# Patient Record
Sex: Female | Born: 1990
Health system: Western US, Academic
[De-identification: ages and names within clinical notes are randomized; demographics above are authoritative.]

---

## 2021-09-12 ENCOUNTER — Ambulatory Visit: Payer: PRIVATE HEALTH INSURANCE

## 2021-09-12 DIAGNOSIS — R140 Abdominal distension (gaseous): Secondary | ICD-10-CM

## 2021-09-12 NOTE — Progress Notes
S:  30 y.o. female with below past medical history presenting for:    Chief Complaint   Patient presents with   ? Bloating     Ongoing, x 1 wk     Feels lower abd has been larger for ~10d  Nothing like this before  No dysuria, urgency, or constipation  Not worsened w/ certain foods  No f/c    Reports hx of GAD  Cardiac eval in past for tachycardia/palpitations unrevealing for serious cause    LMP - does not have on Lo Loestrin  Reports missing one dose 3 mo ago otherwise compliant    ROS:  Negative except as stated above.    No past medical history on file.       O:  BP 121/78  ~ Pulse (!) 114  ~ Temp 36.7 ?C (98.1 ?F) (Forehead)  ~ Resp 18  ~ Ht 5' 3'' (1.6 m)  ~ Wt 121 lb (54.9 kg)  ~ SpO2 100%  ~ BMI 21.43 kg/m?     Vitals reviewed  General: NAD  HEENT: NC/AT  CV: Regular rhythm, tachycardia  Lungs: NL CTA b/l no wrr  Abd: S/NT/ND throughout, no masses, no rebound or guarding  Neuro: No gross deficits    Recent Results (from the past 24 hour(s))   POCT urinalysis dipstick    Collection Time: 09/12/21  9:19 AM   Result Value Ref Range    Glucose Negative Negative    Bilirubin Negative Negative    Ketone Trace (A) Negative    Specific Gravity 1.030 <1.005, 1.005, 1.010, 1.015, 1.020, 1.025, 1.030    Blood Trace (A) Negative    pH 6.5 <5.0, 5.0, 5.5, 6.0, 6.5, 7.0, 7.5, 8.0, 8.5    Protein Trace (A) Negative    Urobilinogen 1.0 mg/dL 0.2 mg/dL, 1.0 mg/dL    Nitrite Negative Negative    Leukocyte Negative Negative   POCT urine pregnancy    Collection Time: 09/12/21  9:20 AM   Result Value Ref Range    Preg Test, Ur, Manual Negative          A/P:  30 y.o. female presenting with:  1. Abdominal bloating  - POCT urinalysis dipstick; Future  - POCT urine pregnancy; Future  - POCT urinalysis dipstick  - POCT urine pregnancy  - Bacterial Culture Urine, CLINIC COLLECT TODAY; Future  - Bacterial Culture Urine, CLINIC COLLECT TODAY    Exam unremarkable. Pt describes lower abd fullness, no tenderness, no urinary or vaginal symptoms. No issues w/ specific foods. Urine studies show neg pregnancy, trace microscopic hematuria, trace ketones, trace protein. Advise ample hydration and will repeat urine study in one month during annual exam.  F/u culture.  -Reviewed available results and/or past documentation.  -Return precautions and/or emergency room precautions were provided for persistent or worsening symptoms when appropriate.  -The above plan of care, diagnosis, orders, and follow-up were discussed with the patient.  Questions related to this recommended plan of care were answered.    Portions of this note may have been created with voice recognition software. Occasional wrong-word or ''sound-alike'' substitutions may have occurred due to the inherent limitations of voice recognition software. Please read the chart carefully and recognize, using context, where these substitutions have occurred.

## 2021-09-14 LAB — Bacterial Culture Urine: BACTERIAL CULTURE URINE: NO GROWTH {titer}

## 2021-10-14 ENCOUNTER — Ambulatory Visit: Payer: PRIVATE HEALTH INSURANCE

## 2021-10-14 DIAGNOSIS — R3129 Other microscopic hematuria: Secondary | ICD-10-CM

## 2021-10-14 DIAGNOSIS — Z0000 Encounter for general adult medical examination without abnormal findings: Secondary | ICD-10-CM

## 2021-10-15 LAB — UA,Microscopic: SQUAMOUS EPITHELIAL CELLS: 0 {cells}/uL (ref 0–17)

## 2021-10-15 LAB — UA,Dipstick: PH,URINE: 6 (ref 5.0–8.0)

## 2021-10-15 NOTE — Progress Notes
S:  31 y.o. female with below past medical history presenting for:    Chief Complaint   Patient presents with    Follow-up     Better, meds refill Lo loestrin     Follow up for abdominal bloating  Feeling improved and reports no abdominal symptoms  Needs OCP refill    ROS:  Negative except as stated above.    No past medical history on file.       O:  BP 124/84  ~ Pulse 92  ~ Temp 36.6 C (97.8 F) (Forehead)  ~ Resp 15  ~ Ht 5' 3'' (1.6 m)  ~ Wt 120 lb (54.4 kg)  ~ SpO2 100%  ~ BMI 21.26 kg/m     Vitals reviewed  General: NAD  HEENT: NC/AT  CV: RR  Lungs: NL  Abd: S/NT/ND no masses  Neuro: No gross deficits    No results found for this or any previous visit (from the past 24 hour(s)).      A/P:  31 y.o. female presenting with:  1. Abdominal bloating    2. Microscopic hematuria  - Urinalysis w/Reflex to Culture; Future  - Urinalysis w/Reflex to Culture    3. Routine health maintenance  - Referral to Obstetrics & Gynecology    1. Resolved. Monitor.  2. Trace microscopic hematuria noted at last visit. Repeat UA.  3. GYN referral request for routine care  -Reviewed available results and/or past documentation.  -Return precautions for persistent or worsening symptoms when appropriate.  -The above plan of care, diagnosis, orders, and follow-up were discussed with the patient.  Questions related to this recommended plan of care were answered.    Portions of this note may have been created with voice recognition software. Occasional wrong-word or ''sound-alike'' substitutions may have occurred due to the inherent limitations of voice recognition software. Please read the chart carefully and recognize, using context, where these substitutions have occurred.

## 2021-10-19 DIAGNOSIS — R140 Abdominal distension (gaseous): Secondary | ICD-10-CM

## 2021-10-19 DIAGNOSIS — Z30011 Encounter for initial prescription of contraceptive pills: Secondary | ICD-10-CM

## 2021-10-19 MED ORDER — NORETHIN-ETH ESTRAD-FE BIPHAS 1 MG-10 MCG / 10 MCG PO TABS
1 | Freq: Every day | ORAL | 3 refills | Status: AC
Start: 2021-10-19 — End: ?

## 2021-10-19 NOTE — Addendum Note
Addended by: Oren Section on: 10/19/2021 07:10 AM     Modules accepted: Orders

## 2022-04-17 ENCOUNTER — Ambulatory Visit: Payer: PRIVATE HEALTH INSURANCE

## 2022-12-01 ENCOUNTER — Telehealth: Payer: PRIVATE HEALTH INSURANCE

## 2022-12-01 ENCOUNTER — Telehealth: Payer: BLUE CROSS/BLUE SHIELD

## 2022-12-01 DIAGNOSIS — Z30011 Encounter for initial prescription of contraceptive pills: Secondary | ICD-10-CM

## 2022-12-01 MED ORDER — LO LOESTRIN FE 1 MG-10 MCG / 10 MCG PO TABS
1.0 | Freq: Every day | ORAL | 3 refills
Start: 2022-12-01 — End: ?

## 2022-12-01 MED ORDER — NORETHIN-ETH ESTRAD-FE BIPHAS 1 MG-10 MCG / 10 MCG PO TABS
1 | Freq: Every day | ORAL | 3 refills | Status: AC
Start: 2022-12-01 — End: ?

## 2022-12-01 NOTE — Telephone Encounter
Refill Request    Verified patient was seen within the last 6 months. If not seen within 6 months, offered appointment. Yes, pt wanted to see if calling the office she can get it refilled did try to make appt but she wanted to send a message preferably.She's on a  time crunch pt unable to come in due to traveling 12/02/22 and will return end of March     Collected prescription information from patient. norethindrone-ethinyl estradiol (LO LOESTRIN FE) 1 mg-10 mcg/10 mcg tablet     Pended orders for provider to review and sign.    Pharmacy location was confirmed and class was set for pended orders.   [id number] [place]    Phone: [phone]  Fax: [phone]      Patient or caller has been notified of the turnaround time of 1-2 business day(s).

## 2022-12-01 NOTE — Progress Notes
[facility]  Telehealth   Immediate Care Progress Note        Date: 12/01/2022    Patient Consent to Telehealth   The patient agreed to participate in the video visit prior to joining the visit.        SUBJECTIVE:     Mary Lester is a 32 y.o. female who presents for:    Wanted refill for OCP Lo Loestrin. She took last one today. Leaving for a 3 weeks trip tomorrow. Requesting refill. No other concerns.      No past medical history on file.  No past surgical history on file.  No family history on file.  Social History     Socioeconomic History    Marital status: Single       Medication:  Prior to Admission medications    Medication Sig Start Date End Date Taking? Authorizing Provider   busPIRone 5 mg tablet  06/13/19   PROVIDER, HISTORICAL   levocetirizine 5 mg tablet Take by mouth. 09/11/18   PROVIDER, HISTORICAL   norethindrone-ethinyl estradiol (LO LOESTRIN FE) 1 mg-10 mcg/10 mcg tablet Take 1 tablet by mouth daily. 10/19/21   Thurnell Garbe., MD   vilazodone 20 mg tablet  06/13/19   PROVIDER, HISTORICAL       Allergies: No Known Allergies    PHYSICAL EXAM:     There were no vitals taken for this visit.    This was a Telehealth visit. The extent of the physical exam was limited to the elements below.     Constitutional: healthy,  alert and  not in acute distress  Mental Status: Alert and oriented to time, place and person.  Recent and remote memory are intact. Mood and affect are normal.      ASSESSMENT & PLAN:     Mary Lester is a 32 y.o. female who presents for:    Diagnoses and all orders for this visit:    Encounter for prescription of oral contraceptives  -     norethindrone-ethinyl estradiol (LO LOESTRIN FE) 1 mg-10 mcg/10 mcg tablet; Take 1 tablet by mouth daily.      - Refill OCP  - Discussed if missed days then recommend secondary protection using barrier method      Orders Placed This Encounter    norethindrone-ethinyl estradiol (LO LOESTRIN FE) 1 mg-10 mcg/10 mcg tablet       The above plan was explained and reviewed extensively with the patient.  All questions were answered.    Deatra James, MD  Family Medicine  [facility]

## 2022-12-01 NOTE — Telephone Encounter
Patient requesting norethindrone-ethinyl estradiol (LO LOESTRIN FE) 1 mg-10 mcg/10 mcg tablet. Last seen 10/19/2021. Encounter routed to [employer] to assist patient with visit.

## 2022-12-01 NOTE — Telephone Encounter
Call pt & advised pt set up a virutal visit with immediate care for her refill request as PCP did not have availability for today at the moment. Pt was on a time crunch & said will attempt to schedule with IC

## 2023-02-19 MED ORDER — NORETHIN-ETH ESTRAD-FE BIPHAS 1 MG-10 MCG / 10 MCG PO TABS
1 | Freq: Every day | ORAL | 3 refills
Start: 2023-02-19 — End: ?

## 2023-11-02 ENCOUNTER — Other Ambulatory Visit: Payer: PRIVATE HEALTH INSURANCE

## 2023-11-02 ENCOUNTER — Ambulatory Visit: Payer: PRIVATE HEALTH INSURANCE

## 2023-11-02 DIAGNOSIS — Z30011 Encounter for initial prescription of contraceptive pills: Secondary | ICD-10-CM

## 2023-11-02 MED ORDER — NORETHIN-ETH ESTRAD-FE BIPHAS 1 MG-10 MCG / 10 MCG PO TABS
1 | Freq: Every day | ORAL | 2 refills | Status: AC
Start: 2023-11-02 — End: ?

## 2023-11-02 MED ORDER — NORETHIN-ETH ESTRAD-FE BIPHAS 1 MG-10 MCG / 10 MCG PO TABS
1 | Freq: Every day | ORAL | 3 refills
Start: 2023-11-02 — End: ?

## 2023-11-02 NOTE — Progress Notes
[facility]    Video Visit    Patient Consent to Telehealth   The patient agreed to participate in the video visit prior to joining the visit.        Patient:  Mary Lester  Medical record number:  [id number]  Date of birth:  1991/04/18  Date of service:  11/02/2023    Cc: refill of ocps     SUBJECTIVE:     Mary Lester is a 32 y.o. female who wishes to discuss:    1. Ocp refill : last pap smear about 1 year ago   Doing well on lo loestrin   No side effects  Normal bp 05/2023      There is no problem list on file for this patient.      Medication:  Outpatient Medications Prior to Visit   Medication Sig    busPIRone 5 mg tablet     levocetirizine 5 mg tablet Take by mouth.    vilazodone 20 mg tablet     norethindrone-ethinyl estradiol (LO LOESTRIN FE) 1 mg-10 mcg/10 mcg tablet Take 1 tablet by mouth daily.     No facility-administered medications prior to visit.       Allergies: No Known Allergies    Social History     Tobacco Use    Smoking status: Not on file    Smokeless tobacco: Not on file   Substance Use Topics    Alcohol use: Not on file         Review of Systems: see HPI    PHYSICAL EXAM:       General Exam: The patient is well-developed, well-nourished and in no apparent distress.   Neurologic/Psychiatric: The patient is alert and oriented. Patient's affect is appropriate.  HEENT: Head is normocephalic, atraumatic. Sclerae are anicteric.  There is no epistaxis. Normally-structured ears.   Respiratory: Breathing is non-labored. There is no use of accessory muscles.     Integument: There is no facial ecchymosis.    STUDIES:     Relevant labs and imaging were reviewed today by me in conjunction with the patient.    Office Visit on 10/14/2021   Component Date Value Ref Range Status    Urine Color 10/14/2021 Light-Yellow    Final    Specific Gravity 10/14/2021 1.013  1.005 - 1.030 Final    pH,Urine 10/14/2021 6.0  5.0 - 8.0 Final    Blood 10/14/2021 Negative  Negative Final    Bilirubin 10/14/2021 Negative  Negative Final    Ketones 10/14/2021 Negative  Negative Final    Glucose 10/14/2021 Negative  Negative Final    Protein 10/14/2021 Negative  Negative Final    Leukocyte Esterase 10/14/2021 Negative  Negative Final    Nitrite 10/14/2021 Negative  Negative Final    RBC per uL 10/14/2021 0  0 - 11 cells/uL Final    WBC per uL 10/14/2021 7  0 - 22 cells/uL Final    RBC per HPF 10/14/2021 0  0 - 2 cells/HPF Final    WBC per HPF 10/14/2021 1  0 - 4 cells/HPF Final    Squamous Epi Cells 10/14/2021 0  0 - 17 cells/uL Final         ASSESSMENT & PLAN:       Diagnoses and all orders for this visit:    Encounter for prescription of oral contraceptives  -     norethindrone-ethinyl estradiol (LO LOESTRIN FE) 1 mg-10 mcg/10 mcg tablet; Take 1  tablet by mouth daily.    Doing well on ocps   Will refill   Follow up with ob-gyn    Call or return to clinic prn if these symptoms worsen or fail to improve as anticipated.     The above plan was explained and reviewed extensively with the patient.  All questions were answered.     Veronda Prude, MD  Family Medicine  [facility]

## 2024-01-15 MED ORDER — NORETHIN-ETH ESTRAD-FE BIPHAS 1 MG-10 MCG / 10 MCG PO TABS
1 | Freq: Every day | ORAL | 2 refills
Start: 2024-01-15 — End: ?

## 2024-01-18 ENCOUNTER — Telehealth: Payer: BLUE CROSS/BLUE SHIELD

## 2024-01-18 DIAGNOSIS — Z30011 Encounter for initial prescription of contraceptive pills: Secondary | ICD-10-CM

## 2024-01-18 MED ORDER — NORETHIN-ETH ESTRAD-FE BIPHAS 1 MG-10 MCG / 10 MCG PO TABS
1 | Freq: Every day | ORAL | 0 refills | Status: AC
Start: 2024-01-18 — End: ?

## 2024-01-18 NOTE — Telephone Encounter
 Attempted to call patient set up an appointment with Dr. Vernie Goodness. Unable to contact patient due to no answer, left voicemail. Thank you!  Michalina Calbert [facility]it Lassie Demorest 01/18/2024 5:22 PM

## 2024-01-18 NOTE — Telephone Encounter
 Refill Request      Pt mentioned she is going out of town tomorrow, asked for a refill request about 2 days ago     She would need this by today     She mentioned if requesting 3 mo might be a concern if she can receive a one month and she can talk with doctor once she is back?      norethindrone-ethinyl estradiol (LO LOESTRIN FE ) 1 mg-10 mcg/10 mcg tablet       [id number] [place] [place] - [address]     Verified patient was seen within the last 6 months. If not seen within 6 months, offered appointment.    Collected prescription information from patient.    Pended orders for provider to review and sign.    Pharmacy location was confirmed and class was set for pended orders.    Patient or caller has been notified of the turnaround time of 1-2 business day(s).

## 2024-01-18 NOTE — Telephone Encounter
 Covering inbox for Dr. Noggle, who is out on vacation. Rx request received for OCP refill. Chart  reviewed, last prescribed 10/2023 in urgent care. Last CPE 2023 with Dr. Vernie Goodness. Note reviewed.  Refill provided today as a courtesy given patient last CPE with Dr. Vernie Goodness 2023. Pt will require CPE for additional refills.     1. Encounter for prescription of oral contraceptives  - norethindrone-ethinyl estradiol (LO LOESTRIN FE ) 1 mg-10 mcg/10 mcg tablet; Take 1 tablet by mouth daily.  Dispense: 84 each; Refill: 0    I spent a total of 7 minutes with this established patient cumulatively within the 7 days of the MyChart message date.   Topics of my discussion, including but not limited to evaluation/assessment/management of the patient complaint are noted in the MyChart message exchange(s).    Total time spent may include providing evaluation and advice, responding to the patient, documenting the encounter, placing orders, and reviewing relevant labs and/or prior notes, if applicable.     1. Encounter for prescription of oral contraceptives        Orders Placed This Encounter    norethindrone-ethinyl estradiol (LO LOESTRIN FE ) 1 mg-10 mcg/10 mcg tablet       Jermy Couper, MD  Family Medicine  [facility]  01/18/2024 4:46 PM

## 2024-01-19 MED ORDER — NORETHIN-ETH ESTRAD-FE BIPHAS 1 MG-10 MCG / 10 MCG PO TABS
1 | Freq: Every day | ORAL | 2 refills
Start: 2024-01-19 — End: ?
